# Patient Record
Sex: Male | Born: 1940 | Race: White | Hispanic: No | Marital: Married | Smoking: Never smoker
Health system: Southern US, Community
[De-identification: ages and names within clinical notes are randomized; demographics above are authoritative.]

## PROBLEM LIST (undated history)

## (undated) DIAGNOSIS — I10 Essential (primary) hypertension: Secondary | ICD-10-CM

## (undated) DIAGNOSIS — F329 Major depressive disorder, single episode, unspecified: Secondary | ICD-10-CM

## (undated) DIAGNOSIS — G7000 Myasthenia gravis without (acute) exacerbation: Secondary | ICD-10-CM

## (undated) DIAGNOSIS — D619 Aplastic anemia, unspecified: Secondary | ICD-10-CM

## (undated) DIAGNOSIS — F32A Depression, unspecified: Secondary | ICD-10-CM

## (undated) DIAGNOSIS — N289 Disorder of kidney and ureter, unspecified: Secondary | ICD-10-CM

## (undated) DIAGNOSIS — E119 Type 2 diabetes mellitus without complications: Secondary | ICD-10-CM

## (undated) HISTORY — PX: ORTHOPEDIC SURGERY: SHX850

## (undated) HISTORY — PX: PALATE / UVULA BIOPSY / EXCISION: SUR128

## (undated) HISTORY — PX: EYE SURGERY: SHX253

---

## 1997-08-18 ENCOUNTER — Encounter: Admission: RE | Admit: 1997-08-18 | Discharge: 1997-11-16 | Payer: Self-pay | Admitting: Anesthesiology

## 1997-10-11 ENCOUNTER — Ambulatory Visit (HOSPITAL_COMMUNITY): Admission: RE | Admit: 1997-10-11 | Discharge: 1997-10-12 | Payer: Self-pay | Admitting: Orthopedic Surgery

## 1997-10-11 ENCOUNTER — Encounter: Payer: Self-pay | Admitting: Orthopedic Surgery

## 1999-09-03 ENCOUNTER — Inpatient Hospital Stay (HOSPITAL_COMMUNITY): Admission: EM | Admit: 1999-09-03 | Discharge: 1999-09-04 | Payer: Self-pay | Admitting: Emergency Medicine

## 1999-09-03 ENCOUNTER — Encounter: Payer: Self-pay | Admitting: Emergency Medicine

## 1999-09-16 ENCOUNTER — Ambulatory Visit (HOSPITAL_COMMUNITY): Admission: RE | Admit: 1999-09-16 | Discharge: 1999-09-16 | Payer: Self-pay | Admitting: Cardiology

## 2000-01-08 ENCOUNTER — Encounter: Payer: Self-pay | Admitting: Emergency Medicine

## 2000-01-08 ENCOUNTER — Emergency Department (HOSPITAL_COMMUNITY): Admission: EM | Admit: 2000-01-08 | Discharge: 2000-01-08 | Payer: Self-pay | Admitting: Emergency Medicine

## 2000-08-03 ENCOUNTER — Encounter: Payer: Self-pay | Admitting: Orthopedic Surgery

## 2000-08-03 ENCOUNTER — Encounter: Admission: RE | Admit: 2000-08-03 | Discharge: 2000-08-03 | Payer: Self-pay | Admitting: Orthopedic Surgery

## 2002-04-07 ENCOUNTER — Other Ambulatory Visit (HOSPITAL_COMMUNITY): Admission: RE | Admit: 2002-04-07 | Discharge: 2002-04-19 | Payer: Self-pay | Admitting: Psychiatry

## 2003-05-22 ENCOUNTER — Ambulatory Visit (HOSPITAL_COMMUNITY): Admission: RE | Admit: 2003-05-22 | Discharge: 2003-05-22 | Payer: Self-pay | Admitting: Ophthalmology

## 2003-05-22 ENCOUNTER — Encounter (INDEPENDENT_AMBULATORY_CARE_PROVIDER_SITE_OTHER): Payer: Self-pay | Admitting: Specialist

## 2005-06-26 ENCOUNTER — Ambulatory Visit: Payer: Self-pay | Admitting: Gastroenterology

## 2005-07-23 ENCOUNTER — Encounter: Payer: Self-pay | Admitting: Gastroenterology

## 2005-07-23 ENCOUNTER — Ambulatory Visit: Payer: Self-pay | Admitting: Gastroenterology

## 2008-02-15 ENCOUNTER — Encounter: Admission: RE | Admit: 2008-02-15 | Discharge: 2008-02-15 | Payer: Self-pay | Admitting: Emergency Medicine

## 2009-11-25 IMAGING — CR DG LUMBAR SPINE COMPLETE 4+V
6 series · 6 of 6 positions shown · non-contrast
Comparison: None

CLINICAL DATA: Fell off ladder yesterday with low back and right
hip pain

LUMBAR SPINE - COMPLETE 4+ VIEW

[view not recorded (1 of 6)]
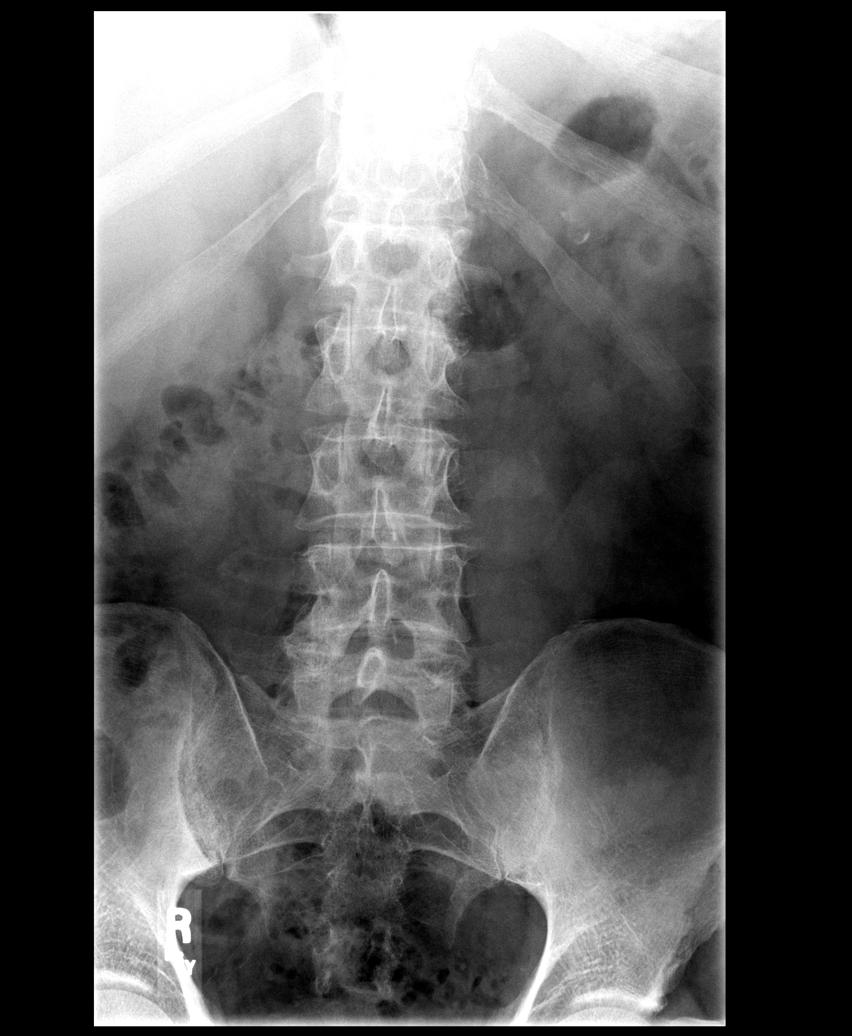

[view not recorded (2 of 6)]
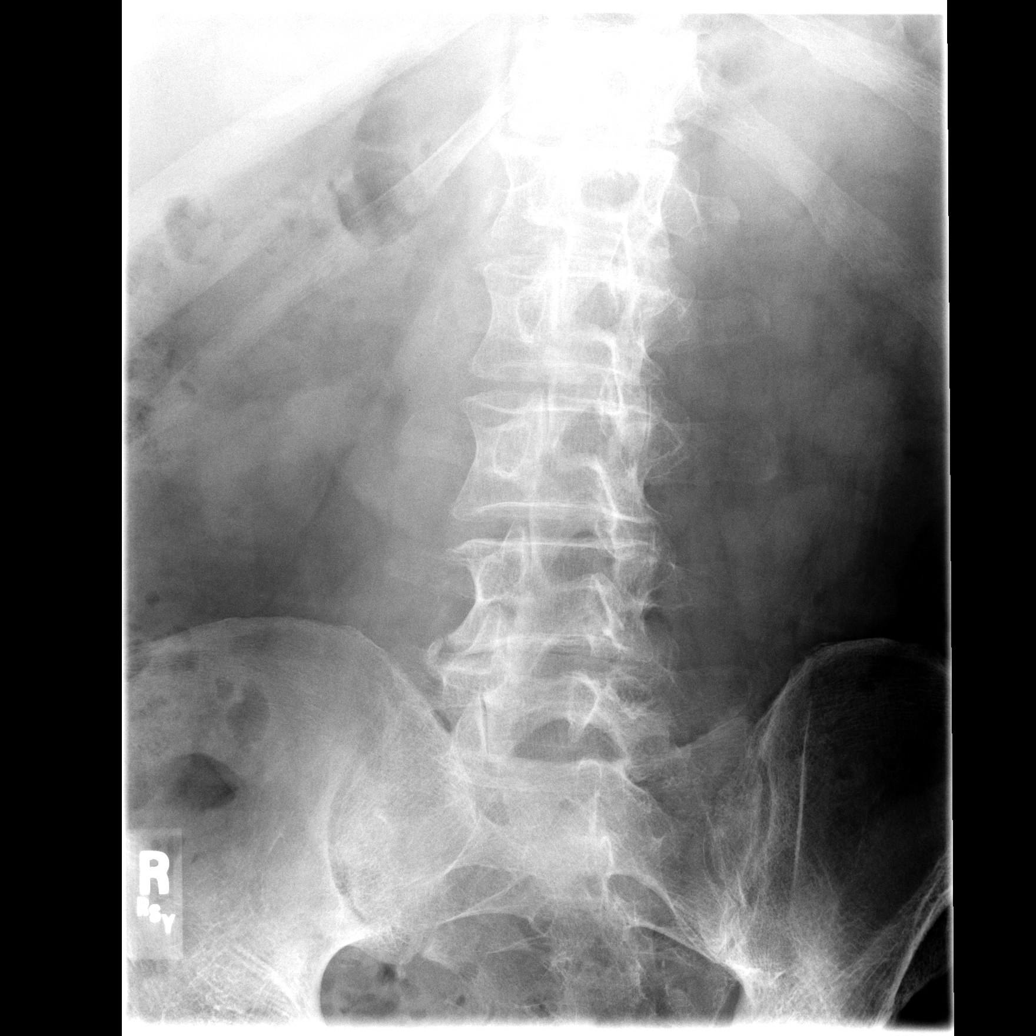

[view not recorded (3 of 6)]
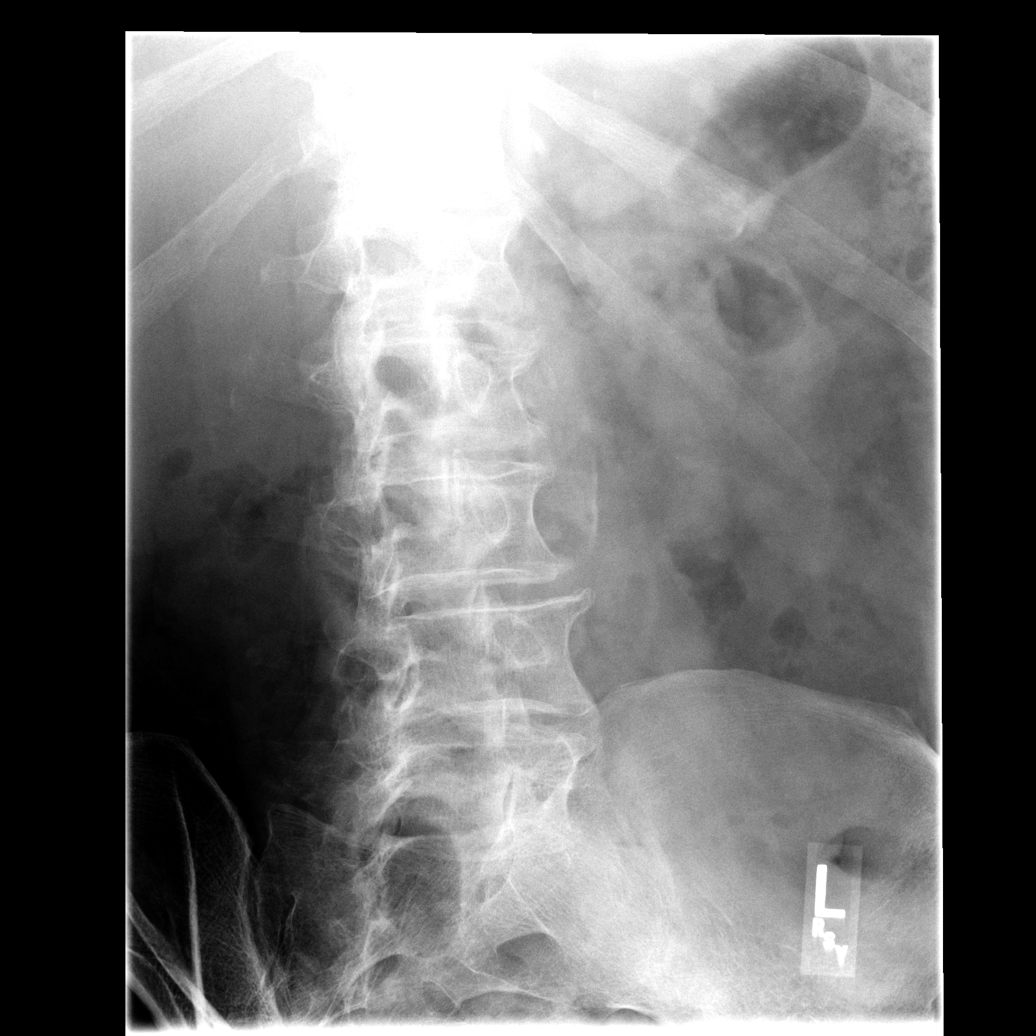

[view not recorded (4 of 6)]
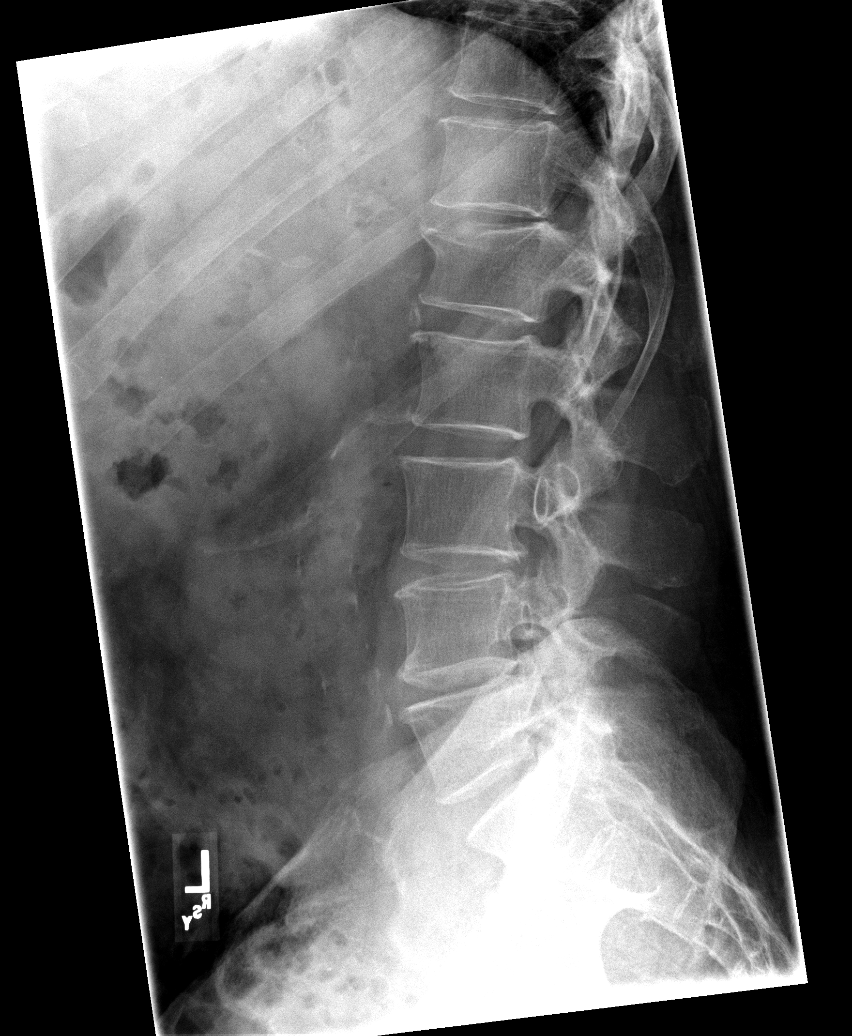

[view not recorded (5 of 6)]
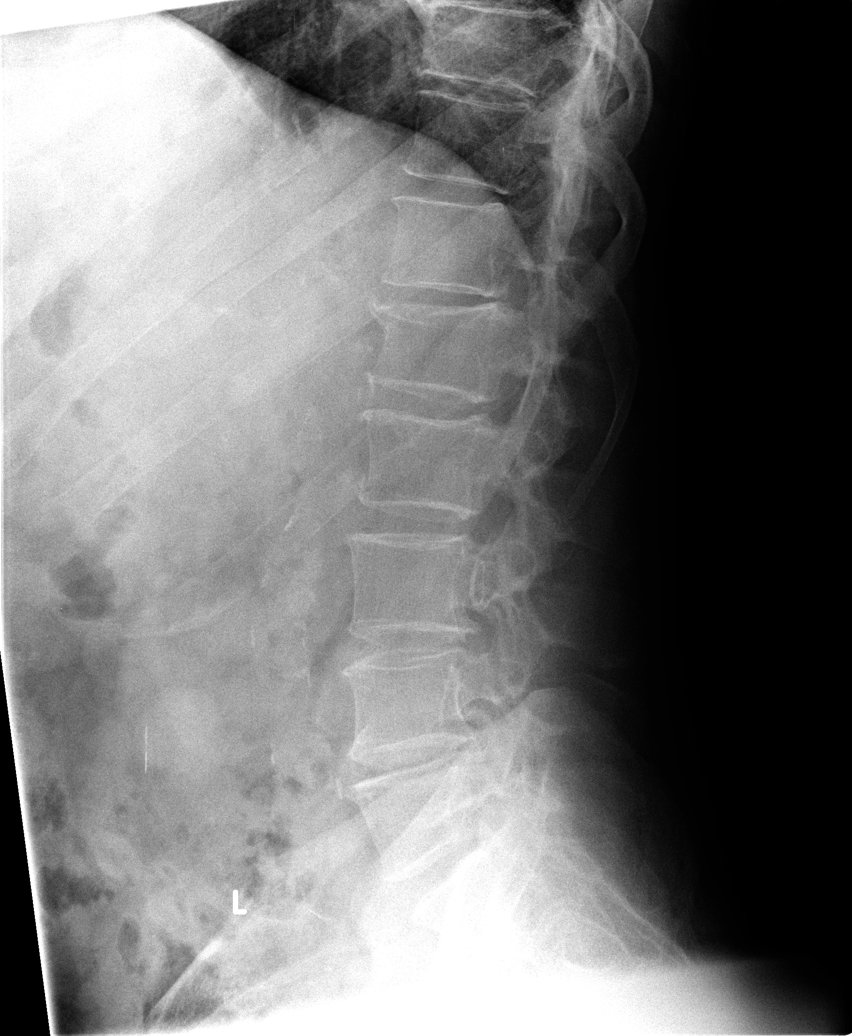

[view not recorded (6 of 6)]
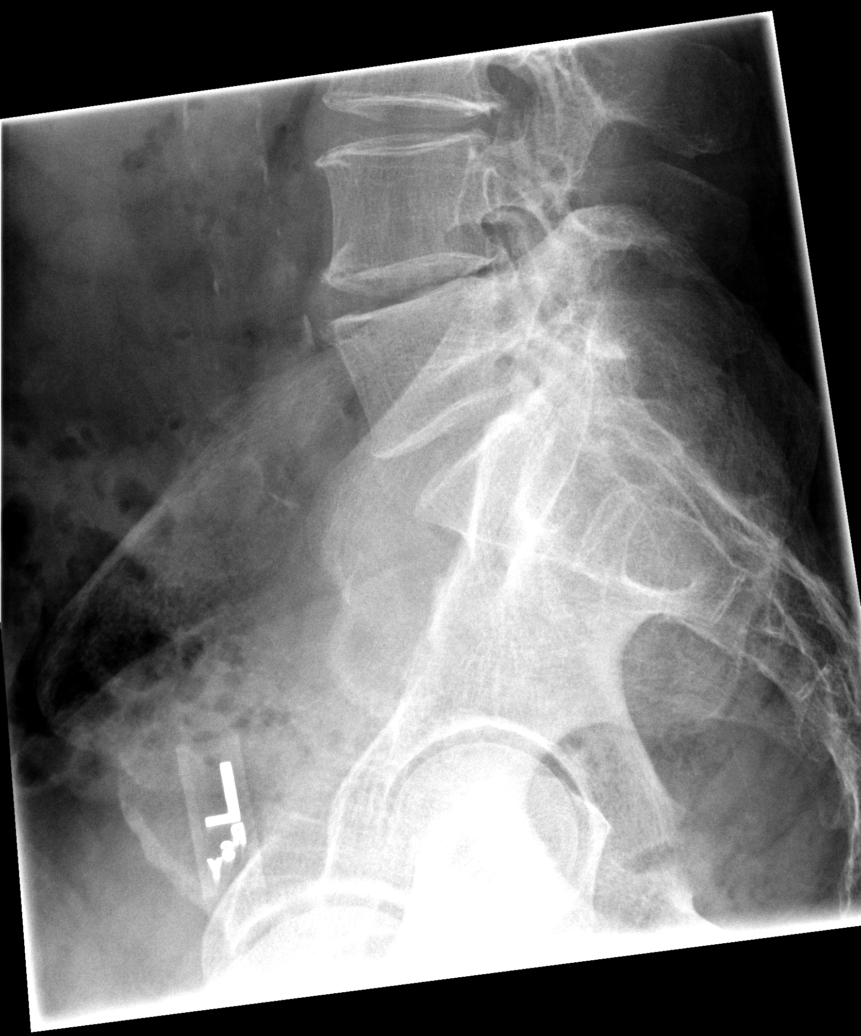

[6 of 6 positions shown; findings below may reference images not displayed]

FINDINGS: The lumbar vertebrae are in normal alignment.  Only the
L4-5 disc space is slightly narrowed.  There is slight irregularity
to the superior endplate of  L1 vertebral body.  There is spurring
at this site and this could represent an old injury, but an acute
process cannot be excluded.  If concerned clinically CT through
this region may be helpful to assess for acuity.  There is some
degenerative change involving the facet joints of L5 S1.  The SI
joints appear normal.
IMPRESSION: 1.  Normal alignment with slight decrease in L4-5 disc space.
2.  Slight irregularity of the superior endplate of L[DATE] be old
but consider CT to exclude subtle acute fracture.

## 2010-07-05 NOTE — Cardiovascular Report (Signed)
[place]. [facility]  Patient:    Alan Hicks                       MRN: [id number] Proc. Date: 09/16/99 Adm. Date:  [id number] Disc. Date: [id number] Attending:  Eleanora Neighbor CC:         Lesle Chris, M.D., [facility]   Cardiac Catheterization  PROCEDURE:  Left heart catheterization with selective coronary angiography, left ventricular angiography.  INDICATIONS:  [name] is a 69 year old male who presented initially with atrial fibrillation.  Evaluation is consistent with hypertensive heart disease, but he had an inferolateral abnormality noted on stress Cardiolite study.  He is referred for evaluation.  His global ejection fraction is 53% and regional wall motion is felt to be satisfactory.  TYPE AND SITE OF ENTRY:  Percutaneous right femoral artery.  CATHETERS:  A 6 French 4 curved Judkins right and left coronary catheters, 6 French pigtail ventriculographic catheter.  CONTRAST MATERIAL:  Omnipaque.  MEDICATIONS GIVEN DURING TO THE PROCEDURE:  None.  MEDICATIONS GIVEN PRIOR TO THE PROCEDURE:  Valium 10 mg p.o.  COMMENTS:  The patient was closed with Perclose.  HEMODYNAMIC DATA:  The aortic pressure was 172/90, LV was 132/15-31. There was no aortic valve gradient noted on pullback, although there was arrhythmia artifact during sampling of the LV.  CORONARY ANATOMY:  LEFT VENTRICULOGRAPHY:  The left ventricular angiogram was performed in the RAO position.  Overall cardiac size and silhouette were normal.  Left ventricular function was normal.  The ejection fraction was 60-70%. There was no mitral regurgitation, intracardiac calcification or intracavitary filling defect.  CORONARY ARTERIES:  The coronary arteries arise and distribute normally.  It is a left dominant system. 1. Right coronary artery:  The right coronary artery is small nondominant    vessel which is normal. 2. Left main:  The left main coronary artery is  normal. 3. Left circumflex:  The left circumflex has essentially five obtuse    marginal branches although one of these continues in intermediate    distribution.  There are minor irregularities in the left circumflex but    no obstructive coronary atherosclerosis is present. 4. Left anterior descending:  The left anterior descending ends on the    anterior wall.  It divides into two diagonal vessels in the continuation    branch.  It is essentially normal.  OVERALL IMPRESSION: 1. Normal left ventricular function. 2. Minimal coronary atherosclerosis and essentially normal coronary arteries.  DISCUSSION:  It is felt that [name] is stable from a cardiac standpoint. He will be managed for his atrial fibrillation and presumed hypertensive heart disease. DD:  09/16/99 TD:  09/17/99 Job: [id number]

## 2010-07-05 NOTE — Discharge Summary (Signed)
[place]. [facility]  Patient:    Alan Hicks, Alan Hicks                      MRN: [id number] Adm. Date:  [id number] Disc. Date: [id number] Attending:  Eleanora Neighbor Dictator:   Jennet Maduro. Earl Gala, R.N., A.N.P. CC:         Maylon Peppers. Cleta Alberts, M.D.             Colleen Can. Deborah Chalk, M.D.                           Discharge Summary  PRIMARY DISCHARGE DIAGNOSIS:  Atrial fibrillation with spontaneous conversion to normal sinus rhythm.  SECONDARY DISCHARGE DIAGNOSES: 1. Myasthenia gravis. 2. Labile hypertension. 3. Obesity. 4. Glucose intolerance. 5. Hypercholesterolemia. 6. B12 deficiency.  HISTORY OF PRESENT ILLNESS:  Alan Hicks is a very pleasant 70 year old obese white male who presented to the emergency room department on September 03, 1999 from Dr. Deetta Perla office with new onset of atrial fibrillation.  He had had a decrease in energy as well as increasing fatigue over the previous few days which were initially attributed to his myasthenia gravis, which is usually a presenting symptom; however, on Tuesday, he began complaining of his heart beating fast, he was somewhat hot and sweaty and had a history and some mild shortness of breath.  While he was there, he was noted to be in atrial fibrillation with a rapid ventricular response and he was subsequently referred here for further evaluation.  Please see the dictated history and physical for further patient presentation and profile.  LABORATORY AND X-RAY FINDINGS:  Cardiac enzymes were negative.  Hematocrit was 42.  White count was 8.9.  Platelets 228,000.  Chemistries revealed sodium of 142, potassium 3.7, chloride 105, BUN 16, glucose of 96.  Twelve-lead electrocardiogram showed atrial fibrillation with a rapid ventricular response.  Chest x-ray reading is pending at this time.  HOSPITAL COURSE:  The patient was admitted to telemetry.  He was placed on IV Cardizem cautiously in the emergency room for rate  control.  He ruled out negative for myocardial infarction per serial enzymes.  By 10 oclock that evening, he spontaneously converted back to normal sinus rhythm and had considerable improvement in his symptoms.  On the following morning, he continued to feel well with no complaints.  He had no recurrent chest pain. Enzymes remained negative.  He was noted to have a fasting blood sugar that morning of 116 and his diet was changed over to a no concentrated sweet diet. We proceeded on with 2-D echocardiogram, which does demonstrate some left ventricular hypertrophy; please see the full dictated report for details.  It was our plan at that time to proceed on with discharge and continue evaluation on an outpatient basis.  DISCHARGE CONDITION:  Stable.  DISCHARGE MEDICATIONS:  He will resume all of his previous medicines as before coming to the hospital with no changes made; this includes: 1. Lotensin 20 mg b.i.d. 2. Mestinon 60 mg q.4h. 3. Imuran 50 mg a day. 4. Cardura 1 mg at bedtime. 5. Aspirin daily. 6. Prednisone 20 mg daily. 7. B12 and B6 as before.  ACTIVITY:  Activity is to be light.  He is asked to rest over the next few days and not to travel.  DIET:  Diet is low fat, no concentrated sweets and to avoid caffeine.  SPECIAL INSTRUCTIONS:  He is to call  our office if he has any recurrence of symptoms or problems; otherwise, we will plan on doing a stress Cardiolite study on Monday, September 09, 1999, at 9:30 a.m. at our office. DD:  09/05/99 TD:  09/09/99 Job: [id number]

## 2010-07-05 NOTE — Op Note (Signed)
NAME:  Alan Hicks, Alan Hicks NO.:  [id number]   MEDICAL RECORD NO.:  [id number]                   PATIENT TYPE:  OIB   LOCATION:  2899                                 FACILITY:  [facility]   PHYSICIAN:  Alford Highland. Rankin, M.D.                DATE OF BIRTH:  May 06, 1940   DATE OF PROCEDURE:  05/22/2003  DATE OF DISCHARGE:  05/22/2003                                 OPERATIVE REPORT   PREOPERATIVE DIAGNOSIS:  1. Dislocated intraocular lens, right eye.  2. Aphakia, right eye.   POSTOPERATIVE DIAGNOSIS:  1. Dislocated intraocular lens, right eye.  2. Aphakia, right eye.   PROCEDURE:  1. Posterior vitrectomy of the right eye.  2. Removal of non-magnetic foreign body - posterior implant of the vitreous     cavity of the right eye.  3. Insertion of anterior chamber intraocular lens - secondary, right eye.   LENS:  [id number], power +17 diopter, serial number [id number], style [facility]   SURGEON:  Jillyn Hidden A. Rankin, M.D.   ANESTHESIA:  Local retrobulbar and monitored anesthesia control.   INDICATIONS FOR PROCEDURE:  The patient is a 70 year old who has profound  vision loss of the right eye on the basis of spontaneous dislocation into  the vitreous cavity of an intraocular lens.  This is an attempt to retrieve  this intraocular lens to prevent damage to the level and also to correct the  aphakic situation with secondary implant.  The patient understands the risks  of anesthesia including the rare occurrence of death and loss to the eye,  including but not limited to hemorrhage, infection, scarring, need for  further surgery, no change in vision, loss of vision, and progression of  disease despite intervention.  Appropriate signed consent was obtained.  The  patient was taken to the operating room.   In the operating room, appropriate monitors were applied followed by mild  sedation.  0.75% Marcaine delivered, 5 mL retrobulbar and an additional 5 mL  for a  modified [employer]  The right periocular region was sterilely prepped  and draped in the usual ophthalmic fashion.  A lid speculum was applied.  A  conjunctival peritomy was then fashioned superiorly and inferotemporally.  The 4 mm infusion was secured 3.5 mm posterior to the limbus.  Placement in  the vitreous cavity was verified visually.  A superior sclerotomy was  fashioned.  The Genevie Ann microscope was placed into position with the BIOM  attachment.  A core vitrectomy was then begun at the superior sclerotomy.  Mobilization of the intraocular lens was confirmed.  The posterior hyaloid  was removed.  The [facility] forceps were used to engage the haptic and this was  brought to the pupillary plane.  A groove limbal incision was fashioned  superiorly and the anterior chamber opened and deepened with Provisc or  other viscoelastic.  The intraocular lens was then grasped across  the limbal  wound and removed without difficulty.  There was poor capsular support thus  requiring placement of an anterior chamber intraocular lens.  The anterior  chamber lens was then placed into the anterior chamber under protection of  the cornea with viscoelastic.  The anterior chamber wound was then closed  with interrupted 10-0 nylon suture.  The viscoelastic was irrigated from the  eye.  The peripheral retina was inspected and found to be no retinal holes  or tears.  The vitreous baser was trimmed.  The superior sclerotomy was  closed with 7-0 Vicryl.  The infusion was removed and similarly closed.  The  conjunctiva was closed with 7-0 Vicryl.  Subconjunctival injection of  antibiotic and steroid were applied.  The patient tolerated the procedure  without complications.                                               Alford Highland Rankin, M.D.    GAR/MEDQ  D:  06/15/2003  T:  06/15/2003  Job:  [id number]

## 2010-07-05 NOTE — H&P (Signed)
[place]. [facility]  Patient:    Alan Hicks, Alan Hicks                      MRN: [id number] Adm. Date:  [id number] Disc. Date: [id number] Attending:  Eleanora Neighbor Dictator:   Jennet Maduro. Earl Gala, R.N., A.N.P.-C. CC:         [name], M.D.                         History and Physical  CHIEF COMPLAINT:  None.  HISTORY OF PRESENT ILLNESS:  Mr. Bosher is a pleasant 70 year old male who was recently admitted and discharged from [facility] after an episode of paroxysmal atrial fibrillation.  He converted with IV Cardizem.  He was referred on for an outpatient stress Cardiolite study which has been performed on September 09, 1999.  He demonstrated fair exercise tolerance with an adequate blood pressure response.  Clinically there were no complaints of angina. Electrocardiographically was negative for ischemia, and he had a normal ejection fraction and normal wall motion; however, there was an inferolateral perfusion defect with a partial reperfusion at rest that would suggest ischemia.  He is now referred on for a cardiac catheterization.  Clinically he has remained stable from a cardiac standpoint, with no further symptoms.  PAST MEDICAL HISTORY:  1. Myasthenia gravis diagnosed in June 2000.  2. Labile hypertension.  3. Obesity.  4. Chronic steroid therapy.  5. History of fractured left elbow with subsequent repair.  6. Bilateral eye surgery.  7. Lumbar surgery at L4-L5.  8. Glaucoma.  9. B12 deficiency. 10. Hypercholesterolemia.  ALLERGIES:  No known drug allergies.  CURRENT MEDICATIONS:  1. Lotensin 20 mg b.i.d.  2. Mestinon 60 mg q.4h. p.r.n.  3. Imuran 50 mg t.i.d.  4. Baby aspirin q.d.  5. Prednisone 20 mg q.a.m., 10 mg q. evening.  6. B12, 1000 mg q.d.  7. B6, 100 mg q.d.  8. Vitamin E 200 IU q.d.  9. Garlic pill b.i.d. 10. Alphagan eye drops b.i.d. 11. Doxazosin 1 mg q. bedtime.  FAMILY HISTORY:  Unchanged from the prior  record.  SOCIAL HISTORY:  Unchanged from the prior record.  REVIEW OF SYSTEMS:  Otherwise unremarkable.  PHYSICAL EXAMINATION:  GENERAL:  He is an obese white male, in no acute distress.  VITAL SIGNS:  Blood pressure 140/82 sitting, 150/90 standing, heart rate 76 and  regular, respirations 18.  He is afebrile.  Weight is 265 pounds.  SKIN:  Warm and dry.  Color is unremarkable.  NECK:  Supple, no jugular venous distention.  LUNGS:  Clear.  HEART:  A regular rhythm.  ABDOMEN:  Obese, soft, with positive bowel sounds.  EXTREMITIES:  Without edema.  NEUROLOGIC:  Intact.  No gross focal deficits.  LABORATORY DATA:  Currently pending.  IMPRESSION: 1. Abnormal stress Cardiolite study with a suggestion of inferolateral perfusion    defects. 2. Paroxysmal atrial fibrillation. 3. Labile hypertension. 4. Myasthenia gravis.  PLAN:  Will proceed on with an elective electrocardiogram.  The risks, procedures, and benefits have been explained, and he is willing to proceed. DD:  09/13/99 TD:  09/14/99 Job: [id number]

## 2010-07-05 NOTE — H&P (Signed)
[place]. [facility]  Patient:    Alan Hicks, Alan Hicks                      MRN: [id number] Adm. Date:  [id number] Attending:  Eleanora Hicks CC:         Alan Hicks, M.D.                         History and Physical  HISTORY OF PRESENT ILLNESS:  Alan Hicks is a 70 year old male who presents to the emergency room today with the onset of atrial fibrillation.  He had noted decreased energy and fatigue two days ago when he was in [place].  This last evening he did not really feel well and drove back this morning, and drove directly to the [facility] where he was seen by Dr. Lesle Hicks and noted to be in atrial fibrillation.  When he was awakened he was hot and somewhat sweaty, complained f a headache and mild dyspnea, and was aware of his heart beating fast.  Initially t was attributed to symptoms of myasthenia gravis.   He really did not feel palpitations until this morning.  PAST MEDICAL HISTORY: 1. He has had a history of myasthenia gravis, diagnosed in June 2000. 2. He has a history of labile hypertension, for at least the last two or    three years. 3. He is obese.  He attributes this to prednisone therapy. 4. He had a previous repair of his left elbow, secondary to fracture. 5. He has had bilateral eye surgery. 6. History of lumbar surgery on L4-L5. 7. He has had glaucoma. 8. A B12 deficiency. 9. History of hypercholesterolemia.  ALLERGIES:  No known drug allergies.  CURRENT MEDICATIONS: 1. Lotensin 20 mg b.i.d. 2. Mestinon 60 mg q.4h. 3. Azathioprine 50 mg q.d. 4. Cardura 1 mg q.h.s. 5. Aspirin one q.d. 6. Prednisone 20 mg q.d. 7. B12 and vitamin B6.  FAMILY HISTORY:  Father is living at age 77.  He has one brother who has died of cancer.  Mother is living at age 58.  SOCIAL HISTORY:  He is employed in Designer, fashion/clothing.  Works in [place].  No smoking, o alcohol.  He has three children.  REVIEW OF SYSTEMS:  He snores heavily  and has had previous sleep studies.  He has had recent coughing episodes.  PHYSICAL EXAMINATION:  VITAL SIGNS:  Blood pressure 139/91, heart rate in the 90s, respirations 20.  SKIN:  Warm and dry.  He is tanned.  NECK:  Supple, no masses, no bruits.  HEENT:  Oropharynx shows a thick posterior pharynx with a reduced opening.  LUNGS:  Reasonably clear.  HEART:  An irregular irregular rhythm.  No murmur.  ABDOMEN:  Obese.  EXTREMITIES:  Peripheral pulses intact.  No edema.  LABORATORY DATA:  All pending.  CPK negative.  Hematocrit 42.  Potassium 3.7, BUN 16, glucose 92.  OVERALL IMPRESSION: 1. Atrial fibrillation and atrial flutter. 2. Chest pain, rule out myocardial infarction (doubt). 3. Hypertension. 4. Myasthenia gravis. 5. Hypercholesterolemia.  PLAN:  Will check labs, a 2-D echocardiogram.  Will try to stabilize.  Will use IV Cardizem for rate control, but use caution with his myasthenia gravis. DD:  09/03/99 TD:  09/04/99 Job: [id number]

## 2010-09-02 ENCOUNTER — Encounter: Payer: Self-pay | Admitting: Gastroenterology

## 2011-11-06 ENCOUNTER — Encounter: Payer: Self-pay | Admitting: Gastroenterology

## 2011-12-10 ENCOUNTER — Emergency Department (HOSPITAL_COMMUNITY)
Admission: EM | Admit: 2011-12-10 | Discharge: 2011-12-10 | Disposition: A | Discharge status: Home / Self Care | Payer: Medicare Other | Attending: Emergency Medicine | Admitting: Emergency Medicine

## 2011-12-10 ENCOUNTER — Encounter (HOSPITAL_COMMUNITY): Payer: Self-pay | Admitting: Emergency Medicine

## 2011-12-10 DIAGNOSIS — IMO0002 Reserved for concepts with insufficient information to code with codable children: Secondary | ICD-10-CM | POA: Insufficient documentation

## 2011-12-10 DIAGNOSIS — Y9389 Activity, other specified: Secondary | ICD-10-CM | POA: Insufficient documentation

## 2011-12-10 DIAGNOSIS — I10 Essential (primary) hypertension: Secondary | ICD-10-CM | POA: Insufficient documentation

## 2011-12-10 DIAGNOSIS — T6391XA Toxic effect of contact with unspecified venomous animal, accidental (unintentional), initial encounter: Secondary | ICD-10-CM | POA: Insufficient documentation

## 2011-12-10 DIAGNOSIS — E119 Type 2 diabetes mellitus without complications: Secondary | ICD-10-CM | POA: Insufficient documentation

## 2011-12-10 DIAGNOSIS — Z79899 Other long term (current) drug therapy: Secondary | ICD-10-CM | POA: Insufficient documentation

## 2011-12-10 DIAGNOSIS — G7000 Myasthenia gravis without (acute) exacerbation: Secondary | ICD-10-CM | POA: Insufficient documentation

## 2011-12-10 DIAGNOSIS — T63461A Toxic effect of venom of wasps, accidental (unintentional), initial encounter: Secondary | ICD-10-CM | POA: Insufficient documentation

## 2011-12-10 DIAGNOSIS — D619 Aplastic anemia, unspecified: Secondary | ICD-10-CM | POA: Insufficient documentation

## 2011-12-10 DIAGNOSIS — Y929 Unspecified place or not applicable: Secondary | ICD-10-CM | POA: Insufficient documentation

## 2011-12-10 DIAGNOSIS — Z794 Long term (current) use of insulin: Secondary | ICD-10-CM | POA: Insufficient documentation

## 2011-12-10 DIAGNOSIS — N289 Disorder of kidney and ureter, unspecified: Secondary | ICD-10-CM | POA: Insufficient documentation

## 2011-12-10 DIAGNOSIS — F329 Major depressive disorder, single episode, unspecified: Secondary | ICD-10-CM | POA: Insufficient documentation

## 2011-12-10 DIAGNOSIS — F3289 Other specified depressive episodes: Secondary | ICD-10-CM | POA: Insufficient documentation

## 2011-12-10 HISTORY — DX: Type 2 diabetes mellitus without complications: E11.9

## 2011-12-10 HISTORY — DX: Aplastic anemia, unspecified: D61.9

## 2011-12-10 HISTORY — DX: Myasthenia gravis without (acute) exacerbation: G70.00

## 2011-12-10 HISTORY — DX: Major depressive disorder, single episode, unspecified: F32.9

## 2011-12-10 HISTORY — DX: Disorder of kidney and ureter, unspecified: N28.9

## 2011-12-10 HISTORY — DX: Depression, unspecified: F32.A

## 2011-12-10 HISTORY — DX: Essential (primary) hypertension: I10

## 2011-12-10 MED ORDER — HYDROCODONE-ACETAMINOPHEN 5-325 MG PO TABS: 1.0000 | ORAL_TABLET | ORAL | Status: AC | PRN

## 2011-12-10 MED ORDER — HYDROCODONE-ACETAMINOPHEN 5-325 MG PO TABS
1.0000 | ORAL_TABLET | Freq: Once | ORAL | Status: AC
  Administered 2011-12-10: 1 via ORAL
  Filled 2011-12-10: qty 1

## 2011-12-10 NOTE — ED Notes (Signed)
Patient states he got into a nest of yellow jackets yesterday afternoon.  Patient c/o stinging and pain at sting sites. C/o pain to right index finger and left elbow.

## 2011-12-10 NOTE — ED Notes (Signed)
Pt reporting stings to right index finger and left elbow.  Mild redness and swelling noted to areas.  Pt not showing difficulty breathing or any signs of distress at this time.

## 2011-12-10 NOTE — ED Provider Notes (Signed)
History     CSN: [id number]  Arrival date & time 12/10/11  4098   First MD Initiated Contact with Patient 12/10/11 0359      Chief Complaint  Patient presents with  . Insect Bite    (Consider location/radiation/quality/duration/timing/severity/associated sxs/prior treatment) HPI  Alan Hicks is a 71 y.o. male who presents to the Emergency Department complaining of being stung by yellow jackets yesterday afternoon while clearing hay. He put ice on it immediately however they have become painful overnight. Area to his left elbow, right index finger and right hand hurt the worst. He has taken no medicines.  Past Medical History  Diagnosis Date  . Aplastic anemia   . Myasthenia gravis   . Renal disorder   . Diabetes mellitus without complication   . Hypertension   . Depression     Past Surgical History  Procedure Date  . Eye surgery   . Palate / uvula biopsy / excision   . Orthopedic surgery     left elbow    No family history on file.  History  Substance Use Topics  . Smoking status: Never Smoker   . Smokeless tobacco: Not on file  . Alcohol Use: No      Review of Systems  Constitutional: Negative for fever.       10 Systems reviewed and are negative for acute change except as noted in the HPI.  HENT: Negative for congestion.   Eyes: Negative for discharge and redness.  Respiratory: Negative for cough and shortness of breath.   Cardiovascular: Negative for chest pain.  Gastrointestinal: Negative for vomiting and abdominal pain.  Musculoskeletal: Negative for back pain.  Skin: Negative for rash.       Wasp stings to left elbow, right index finger, right hand.  Neurological: Negative for syncope, numbness and headaches.  Psychiatric/Behavioral:       No behavior change.    Allergies  Aminoglycosides; Beta adrenergic blockers; Calcium channel blockers; Erythromycin; Fluvastatin; Lexapro; Metformin and related; Procainamide; Quinidine; Quinine  derivatives; Quinolones; Statins; Zocor; and Zoloft  Home Medications   Current Outpatient Rx  Name Route Sig Dispense Refill  . ACYCLOVIR 400 MG PO TABS Oral Take 400 mg by mouth daily.    . ALBUTEROL SULFATE HFA 108 (90 BASE) MCG/ACT IN AERS Inhalation Inhale 2 puffs into the lungs 4 (four) times daily.    . ASPIRIN 81 MG PO TABS Oral Take 81 mg by mouth daily.    . BUPROPION HCL 100 MG PO TABS Oral Take 200 mg by mouth 2 (two) times daily.    . CHLORTHALIDONE 25 MG PO TABS Oral Take 12.5 mg by mouth daily.    . CHOLECALCIFEROL 400 UNITS PO TABS Oral Take 2,000 Units by mouth daily.    Marland Kitchen EZETIMIBE 10 MG PO TABS Oral Take 5 mg by mouth daily.    . OMEGA-3 FATTY ACIDS 1000 MG PO CAPS Oral Take 2 g by mouth daily.    Marland Kitchen FLUOXETINE HCL 20 MG PO CAPS Oral Take 60 mg by mouth daily.    Marland Kitchen GLIPIZIDE 10 MG PO TABS Oral Take 20 mg by mouth daily before breakfast.    . HYDROCODONE-ACETAMINOPHEN 5-325 MG PO TABS Oral Take 1 tablet by mouth 2 (two) times daily.    . INSULIN ISOPHANE HUMAN 100 UNIT/ML [place] SUSP Subcutaneous Inject 10 Units into the skin at bedtime.    Marland Kitchen KETOCONAZOLE 2 % EX CREA Topical Apply topically 2 (two) times daily. Apply to  feet    . LIDOCAINE HCL 4 % EX SOLN Topical Apply topically as needed.    Marland Kitchen LISINOPRIL 40 MG PO TABS Oral Take 40 mg by mouth daily.    Marland Kitchen NIACIN 500 MG PO TABS Oral Take 250 mg by mouth 2 (two) times daily with a meal.    . OMEPRAZOLE 20 MG PO CPDR Oral Take 20 mg by mouth 2 (two) times daily. Take 30 minutes before meal    . PREDNISONE 20 MG PO TABS Oral Take 20 mg by mouth daily. Take 3 tablets daily for 7 days, then take 2 1/2 tablets daily for 7 days, then take 2 tablets daily for 7 days, then take 1 1/2 tablets daily for 7 days, then take 1 tablet daily for 7 days, then take 1/2 tablet once daily for 7 days    . PYRIDOSTIGMINE BROMIDE 60 MG PO TABS Oral Take 60 mg by mouth 4 (four) times daily.    . SULFAMETHOXAZOLE-TMP DS 800-160 MG PO TABS Oral Take 1  tablet by mouth 2 days. Every Monday-Wednesday-Friday    . TERAZOSIN HCL 5 MG PO CAPS Oral Take 5 mg by mouth at bedtime.    . CYANOCOBALAMIN 250 MCG PO TABS Oral Take 250 mcg by mouth daily.      BP 155/84  Pulse 64  Temp 97.6 F (36.4 C)  Resp 18  Ht 6\' 2"  (1.88 m)  Wt 250 lb (113.399 kg)  BMI 32.10 kg/m2  SpO2 99%  Physical Exam  Nursing note and vitals reviewed. Constitutional: He appears well-developed and well-nourished.       Awake, alert, nontoxic appearance.  HENT:  Head: Atraumatic.  Eyes: Right eye exhibits no discharge. Left eye exhibits no discharge.  Neck: Neck supple.  Cardiovascular: Normal heart sounds.   Pulmonary/Chest: Effort normal and breath sounds normal. He exhibits no tenderness.  Abdominal: Soft. There is no tenderness. There is no rebound.  Musculoskeletal: He exhibits no tenderness.       Baseline ROM, no obvious new focal weakness.  Neurological:       Mental status and motor strength appears baseline for patient and situation.  Skin: No rash noted.       Erythema and bruising to wasp sting sites at left elbow, right index finger and dorsum of right hand.   Psychiatric: He has a normal mood and affect.    ED Course  Procedures (including critical care time)    MDM  Patient stung by wasps while clearing hay yesterday afternoon. Given analgesic. Pt stable in ED with no significant deterioration in condition.The patient appears reasonably screened and/or stabilized for discharge and I doubt any other medical condition or other [facility] requiring further screening, evaluation, or treatment in the ED at this time prior to discharge.  MDM Reviewed: nursing note and vitals           Nicoletta Dress. Colon Branch, MD 12/10/11 [phone]

## 2013-02-17 DEATH — deceased

## 2013-02-18 ENCOUNTER — Telehealth: Payer: Self-pay

## 2013-02-18 NOTE — Telephone Encounter (Signed)
Patient past away per [facility] in [place] [employer] °
